# Patient Record
Sex: Female | Born: 1965 | Race: White | Hispanic: No | Marital: Single
Health system: Southern US, Community
[De-identification: ages and names within clinical notes are randomized; demographics above are authoritative.]

---

## 2012-11-29 ENCOUNTER — Emergency Department: Payer: Self-pay | Admitting: Emergency Medicine

## 2012-12-10 ENCOUNTER — Emergency Department: Payer: Self-pay | Admitting: Emergency Medicine

## 2012-12-10 LAB — URINALYSIS, COMPLETE
Bilirubin,UR: NEGATIVE
Glucose,UR: NEGATIVE mg/dL (ref 0–75)
Hyaline Cast: 4
Ph: 5 (ref 4.5–8.0)
RBC,UR: 136 /HPF (ref 0–5)
Specific Gravity: 1.027 (ref 1.003–1.030)
Squamous Epithelial: 11

## 2012-12-25 LAB — COMPREHENSIVE METABOLIC PANEL
Albumin: 3.2 g/dL — ABNORMAL LOW (ref 3.4–5.0)
Alkaline Phosphatase: 322 U/L — ABNORMAL HIGH (ref 50–136)
BUN: 11 mg/dL (ref 7–18)
Calcium, Total: 8.9 mg/dL (ref 8.5–10.1)
Chloride: 106 mmol/L (ref 98–107)
EGFR (African American): >60
Glucose: 118 mg/dL — ABNORMAL HIGH (ref 65–99)
Potassium: 3.9 mmol/L (ref 3.5–5.1)
SGOT(AST): 47 U/L — ABNORMAL HIGH (ref 15–37)
Total Protein: 6.8 g/dL (ref 6.4–8.2)

## 2012-12-25 LAB — URINALYSIS, COMPLETE
Glucose,UR: NEGATIVE mg/dL (ref 0–75)
Nitrite: NEGATIVE
Ph: 6 (ref 4.5–8.0)
Protein: NEGATIVE
Squamous Epithelial: 7
WBC UR: 12 /HPF (ref 0–5)

## 2012-12-25 LAB — DRUG SCREEN, URINE
Amphetamines, Ur Screen: NEGATIVE (ref ?–1000)
Benzodiazepine, Ur Scrn: POSITIVE (ref ?–200)
Cocaine Metabolite,Ur ~~LOC~~: NEGATIVE (ref ?–300)
Methadone, Ur Screen: NEGATIVE (ref ?–300)
Opiate, Ur Screen: NEGATIVE (ref ?–300)
Tricyclic, Ur Screen: NEGATIVE (ref ?–1000)

## 2012-12-25 LAB — TSH: Thyroid Stimulating Horm: <0.01 u[IU]/mL — ABNORMAL LOW

## 2012-12-25 LAB — CBC
MCH: 26.5 pg (ref 26.0–34.0)
MCHC: 32.7 g/dL (ref 32.0–36.0)
MCV: 81 fL (ref 80–100)
Platelet: 153 10*3/uL (ref 150–440)

## 2012-12-26 ENCOUNTER — Inpatient Hospital Stay: Payer: Self-pay | Admitting: Psychiatry

## 2012-12-27 LAB — BEHAVIORAL MEDICINE 1 PANEL
Albumin: 2.8 g/dL — ABNORMAL LOW (ref 3.4–5.0)
Alkaline Phosphatase: 257 U/L — ABNORMAL HIGH (ref 50–136)
Anion Gap: 6 — ABNORMAL LOW (ref 7–16)
BUN: 14 mg/dL (ref 7–18)
Basophil #: 0 10*3/uL (ref 0.0–0.1)
Basophil %: 0.3 %
Bilirubin,Total: 0.4 mg/dL (ref 0.2–1.0)
Calcium, Total: 9 mg/dL (ref 8.5–10.1)
Chloride: 109 mmol/L — ABNORMAL HIGH (ref 98–107)
Co2: 27 mmol/L (ref 21–32)
Creatinine: 0.43 mg/dL — ABNORMAL LOW (ref 0.60–1.30)
EGFR (African American): >60
EGFR (Non-African Amer.): >60
Eosinophil #: 0.2 10*3/uL (ref 0.0–0.7)
Eosinophil %: 3.8 %
Glucose: 103 mg/dL — ABNORMAL HIGH (ref 65–99)
HCT: 36.3 % (ref 35.0–47.0)
HGB: 12.2 g/dL (ref 12.0–16.0)
Lymphocyte #: 3 10*3/uL (ref 1.0–3.6)
Lymphocyte %: 50.9 %
MCH: 27 pg (ref 26.0–34.0)
MCHC: 33.6 g/dL (ref 32.0–36.0)
MCV: 80 fL (ref 80–100)
Monocyte #: 0.6 x10 3/mm (ref 0.2–0.9)
Monocyte %: 10.6 %
Neutrophil #: 2 10*3/uL (ref 1.4–6.5)
Neutrophil %: 34.4 %
Osmolality: 284 (ref 275–301)
Platelet: 131 10*3/uL — ABNORMAL LOW (ref 150–440)
Potassium: 3.9 mmol/L (ref 3.5–5.1)
RBC: 4.52 10*6/uL (ref 3.80–5.20)
RDW: 14.4 % (ref 11.5–14.5)
SGOT(AST): 24 U/L (ref 15–37)
SGPT (ALT): 30 U/L (ref 12–78)
Sodium: 142 mmol/L (ref 136–145)
Thyroid Stimulating Horm: <0.01 u[IU]/mL — ABNORMAL LOW
Total Protein: 6 g/dL — ABNORMAL LOW (ref 6.4–8.2)
WBC: 5.9 10*3/uL (ref 3.6–11.0)

## 2012-12-27 LAB — URINALYSIS, COMPLETE
Bacteria: NONE SEEN
Bilirubin,UR: NEGATIVE
Blood: NEGATIVE
Glucose,UR: NEGATIVE mg/dL (ref 0–75)
Ketone: NEGATIVE
Leukocyte Esterase: NEGATIVE
Nitrite: NEGATIVE
Ph: 8 (ref 4.5–8.0)
Protein: NEGATIVE
RBC,UR: 7 /HPF (ref 0–5)
Specific Gravity: 1.008 (ref 1.003–1.030)
Squamous Epithelial: 6
Transitional Epi: <1
WBC UR: 1 /HPF (ref 0–5)

## 2012-12-31 LAB — URINALYSIS, COMPLETE
Bacteria: NONE SEEN
Bilirubin,UR: NEGATIVE
Glucose,UR: NEGATIVE mg/dL (ref 0–75)
Ketone: NEGATIVE
Leukocyte Esterase: NEGATIVE
Squamous Epithelial: 3

## 2013-01-01 LAB — TSH: Thyroid Stimulating Horm: <0.01 u[IU]/mL — ABNORMAL LOW

## 2013-01-01 LAB — T4, FREE: Free Thyroxine: 3.69 ng/dL — ABNORMAL HIGH (ref 0.76–1.46)

## 2013-11-22 IMAGING — CR DG CHEST 2V
1 series · 2 of 2 positions shown · non-contrast
Comparison: none

REASON FOR EXAM: cough, chills
COMMENTS:

[Series 1: pa · 0.17mm/px · 2 of 2 slices shown]
[im 1/2]
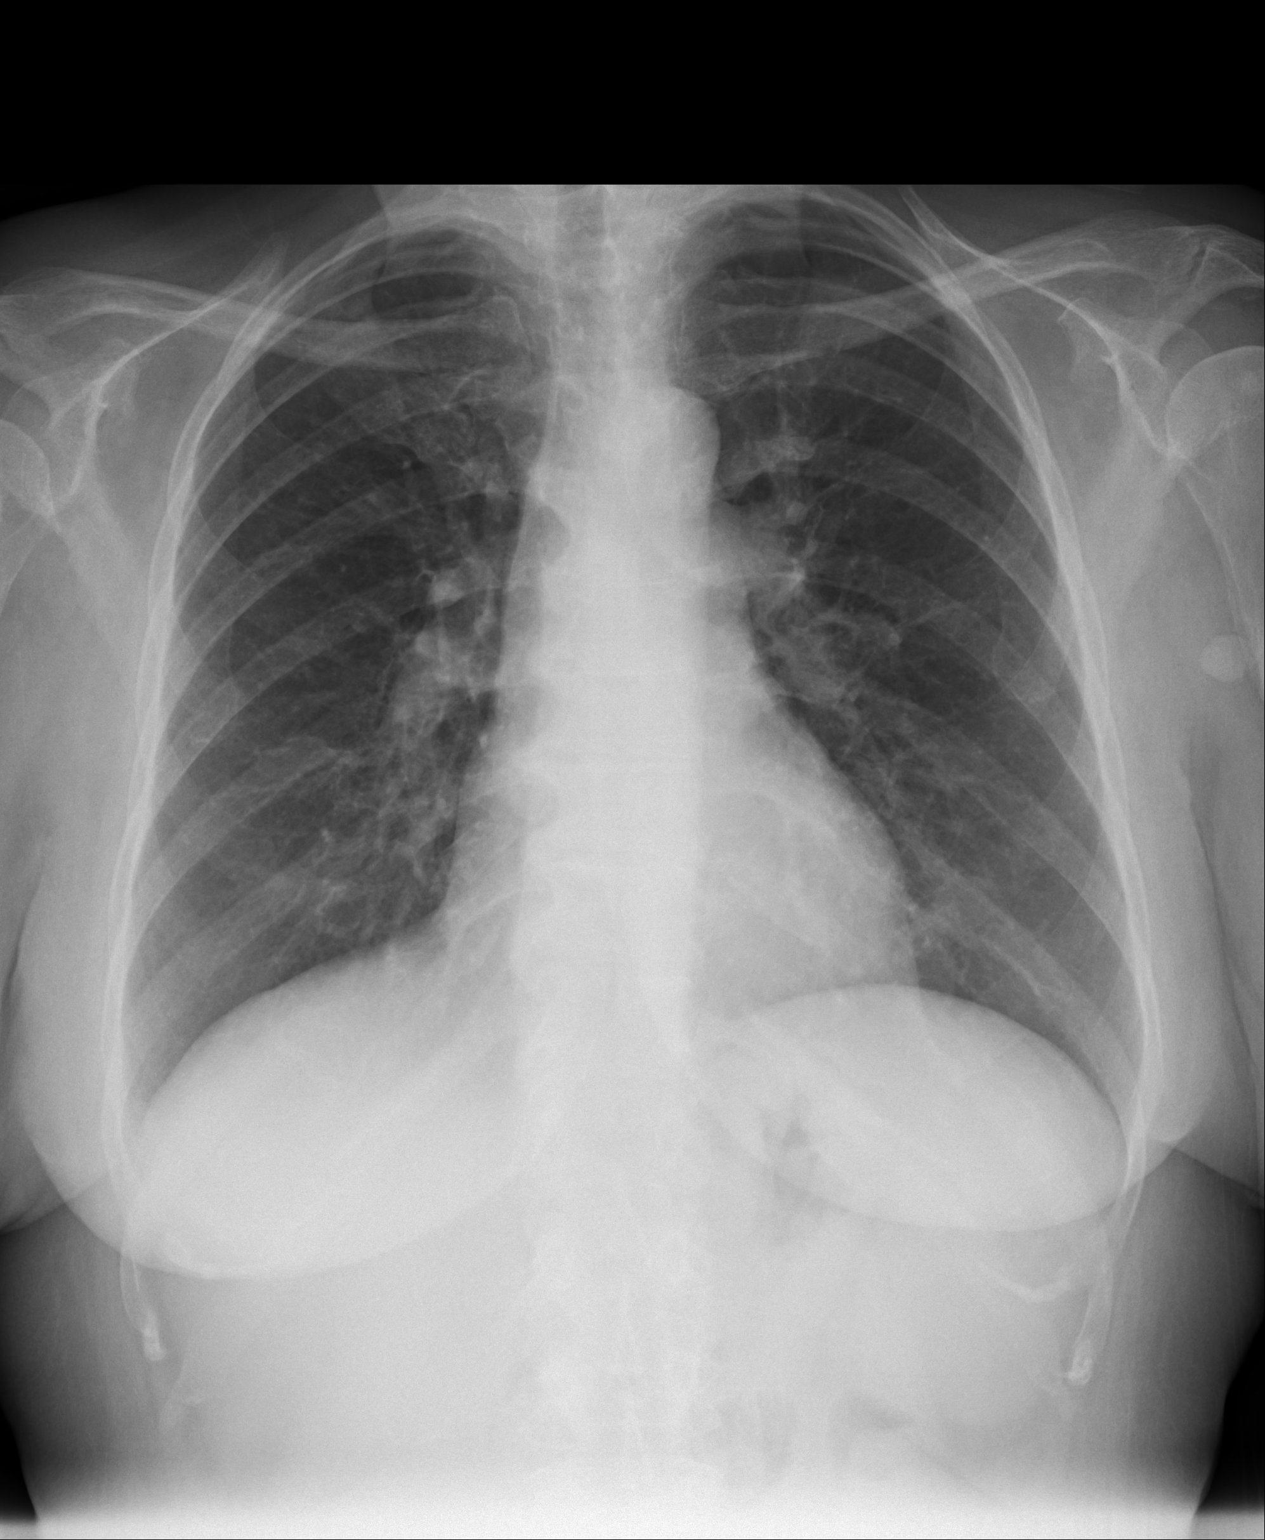
[im 2/2]
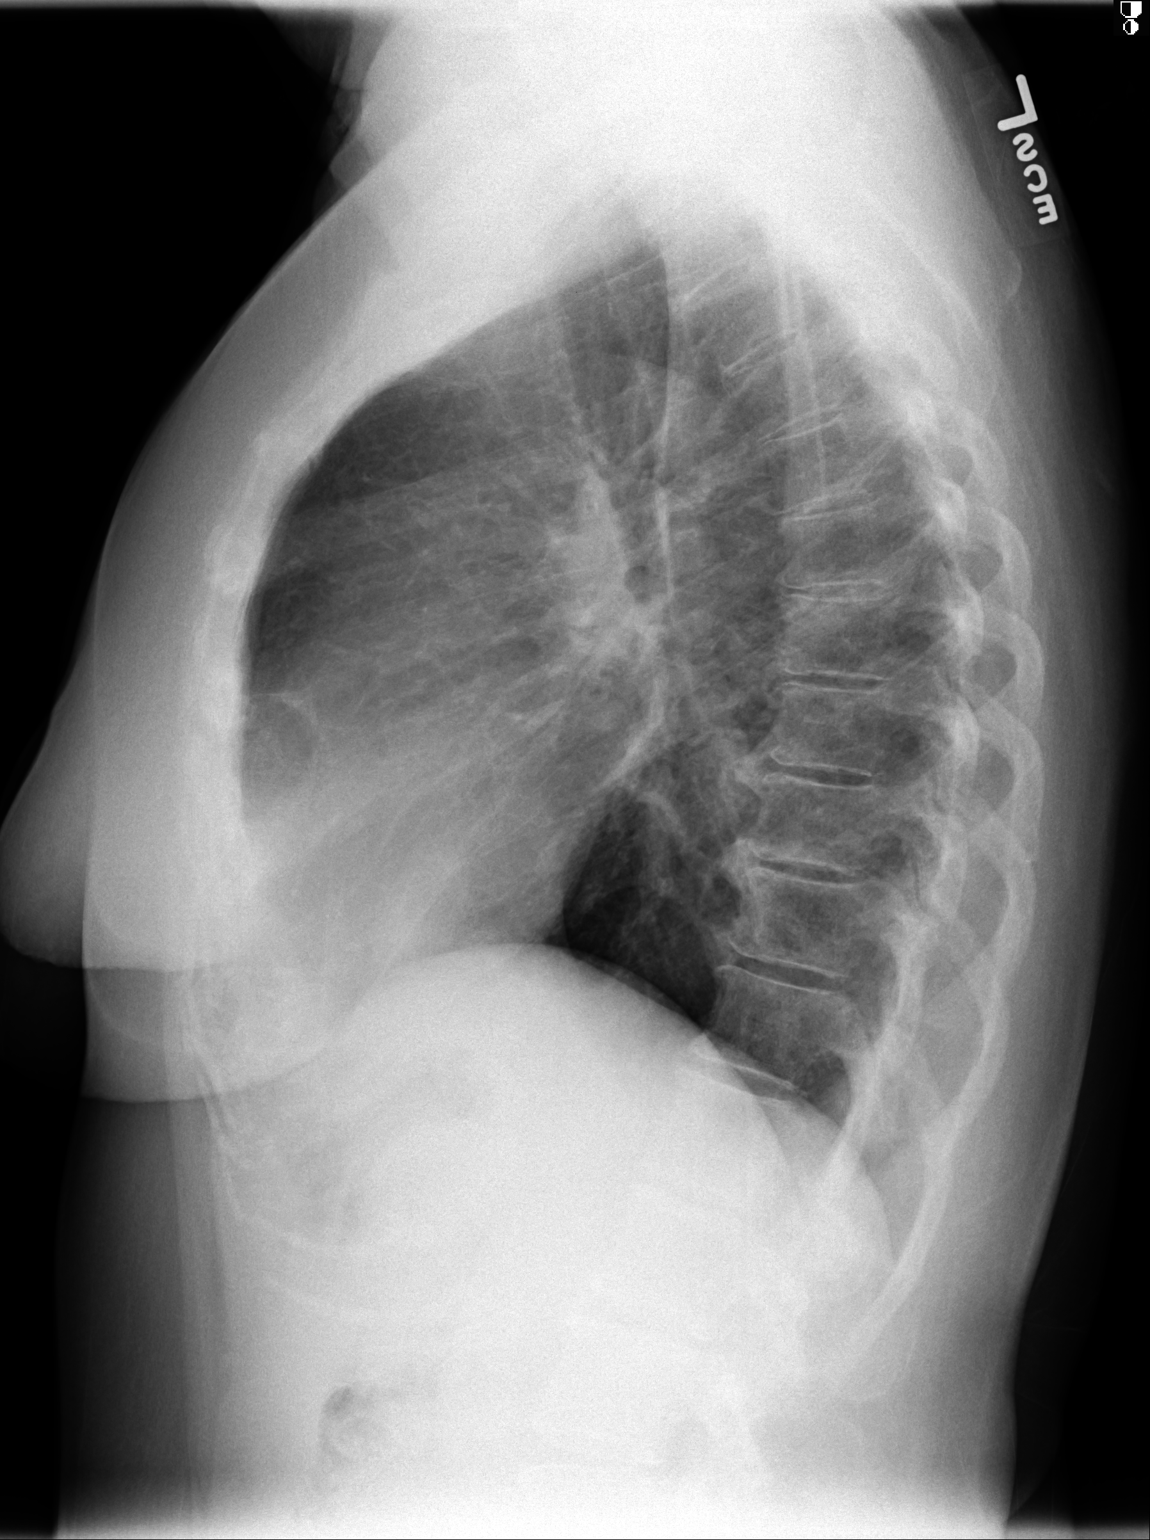

[2 of 2 positions shown; findings below may reference images not displayed]

PROCEDURE:     DXR - DXR CHEST PA (OR AP) AND LATERAL  - November 29, 2012 [DATE]

RESULT:     The lungs are adequately inflated. There is no focal infiltrate.
There are mildly increased interstitial markings in both lungs especially in
the mid and lower zones suggesting subsegmental atelectasis. There is no
pleural effusion. The mediastinum is normal in width. The cardiac silhouette
is normal in size. There are mild degenerative changes of the thoracic
spine. A calcified nodule present in the soft tissues of the left axilla.
IMPRESSION: There are mildly increased interstitial markings in the mid
and lower lung zones suggesting subsegmental atelectasis. There is no
evidence of pneumonia.

[REDACTED]

## 2015-04-02 NOTE — Consult Note (Signed)
Brief Consult Note: Diagnosis: MDD, Recurrent, Severe, W/O PF.   Patient was seen by consultant.   Consult note dictated.   Comments: [facility]  Meds adjusted.  Treatment team to follow.  Electronic Signatures: Rhunette CroftFaheem, Mazelle Huebert S (MD)  (Signed 16-Jan-14 13:26)  Authored: Brief Consult Note   Last Updated: 16-Jan-14 13:26 by Rhunette CroftFaheem, Aydrian Halpin S (MD)

## 2015-04-02 NOTE — Consult Note (Signed)
PATIENT NAME:  Ann Shannon, Ann Shannon MR#:  [id number] DATE OF BIRTH:  1966-06-02  DATE OF CONSULTATION:  12/26/2012  REFERRING PHYSICIAN:  Humberto Leep. Margarita Grizzle, MD CONSULTING PHYSICIAN:  Ardeen Fillers. Garnetta Buddy, MD  REASON FOR CONSULTATION:  Depression.    HISTORY OF PRESENT ILLNESS:  The patient is a 49 year old Caucasian female who drove herself to the Emergency Department due to depressive symptoms and reporting that she has been having suicidal thoughts. The patient stated that "I'm ready for it to be over. I have been staying in my car for the past 2 weeks." The patient stated that she was sent from the shelter from [facility] and it was her time to go. The patient stated that she lost everything. She has 2 older sons who do not have any contact with her.   During my interview with the patient, she reported that she cries all the time and she has been crying nonstop for the past 4 days. She reported that no particular thing happened, but she feels depressed for 4 to 5 months. She feels hopeless, helpless and has been crying. She feels that it is her time to go and she wished that she can go today. She reported that her family does not have any contact with her. Her mother lives in [facility] and one of her sons lives in [place], but they do not want her to be there with them. The patient stated that she has been living in her car for the past 1 month. She is unable to find any job and she spends evenings at the shelter and has been feeling very depressed. She supports herself by hustling. The patient reports that she came to this hospital 2 weeks ago when she was having a kidney infection. She has a long history of diabetes which is uncontrolled as she does not have any money to take the medications. The patient stated that she also was getting Xanax from some of the guys and she drank a couple of beers 2 days ago. She stated that she wants help at this time. She was unable to contract for safety.    PAST PSYCHIATRIC HISTORY:  The patient reported that she was admitted to [facility] at the age of 15 when she was placed by her mother. She does not remember the reason for admission and she does not know the medication.She stated that she has never attempted suicide. She stated that she has a longstanding history of depressive symptoms and persistent suicidal ideations with intent, but no specific plans. She has never attempted suicide. She reported that she has taken some medications in the past, but she does not know the names. She was charged with petty theft in the past.   PAST SUBSTANCE ABUSE HISTORY:  The patient reported that she started drinking at the age of 3. Her last use was a couple of days ago when she used 4 to 5 drinks. She reported that she drank before that in May. She denied any history of seizures or blackouts. She reported that she also used a couple of Xanax a few days ago. She reported that she has lost 200 pounds. She denied using cocaine, marijuana or other drugs. She stated that she is also HIV negative.   FAMILY HISTORY:  The patient reported that her father was admitted to [facility] multiple times. She does not know the reason for the same. She reported that her mother has been diagnosed with depression and she has been  taking medications for the same.   MEDICAL HISTORY:  The patient has a history of diabetes and hypercholesterolemia; however, she has been noncompliant with the medications.   SOCIAL HISTORY:  The patient reported that she is widowed and her husband passed away due to cirrhosis and hepatitis C. She reported that he was a drinker. The patient has 2 sons; 1 lives in [place] and the other lives in [place] and she does not have any contact with them. She currently lives in her car. She stated that she does not have any money. The patient stated that she completed high school. 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 After that, she contracted a bacterial infection in her intestines and was admitted [facility] for almost a month. She lost her job and was unable to find any job after that. She is unable to support herself at this time.   LABORATORY DATA:  Ancillary WBC 7.2, RBC 4.63, hemoglobin 12.3, hematocrit 37.5, platelet count 153, MCV 81, MCH 26.5, RDW 14.4. Glucose was 118, BUN 11, creatinine 0.53, sodium 143, potassium 3.9, chloride 106, bicarbonate 29 and calcium 8.9. Bilirubin 0.6, alkaline phosphatase 322, ALT 41, AST 47, protein 6.8 and anion gap 8. Alcohol level was less than 3. Thyroid less than 0.1. Urine drug screen was positive for benzodiazepines.   MENTAL STATUS EXAMINATION:  The patient is a disheveled-appearing female who appeared older than her stated age. Her mood was depressed. Affect was anxious. Thought process was tangential. Thought content was non-delusional. She does not have any perceptual disturbances at this time. She admitted to feeling very depressed and hopeless. She denied having any suicidal or homicidal ideations or plans, but was unable to contract for safety. She demonstrated fair insight and judgment.   DIAGNOSTIC IMPRESSION:  AXIS I:  1.  Major depressive disorder, recurrent, severe, without psychotic features.  2.  Benzodiazepine abuse.  AXIS II: None.  AXIS III: Copy from the medical history.  AXIS IV: Severe mental illness, poor social support, noncompliance with medications.  AXIS V: Current Global Assessment of Functioning is 25.   TREATMENT PLAN:  1.  The patient will be admitted to the behavioral health unit.  2.  She will be started back on her medications.  3.  She will also be started on CIWA protocol as she was drinking as well as using Xanax.  4.  She will be started on Prozac 20 mg in the morning. 5.  She will be started on trazodone 100 mg at bedtime.  6.  She will also be given Vistaril 25 mg p.o.  q. 6 hours p.r.n. for anxiety.  7.  Treatment team will follow her on a regular basis.    Thank you for allowing me to participate in the care of this patient.    ____________________________ Ardeen FillersUzma S. Garnetta BuddyFaheem, MD usf:si D: 12/26/2012 13:46:25 ET T: 12/26/2012 15:34:30 ET JOB#: [id number]  cc: Ardeen FillersUzma S. Garnetta BuddyFaheem, MD, <Dictator> Rhunette CroftUZMA S Reiko Vinje MD ELECTRONICALLY SIGNED 12/31/2012 12:15

## 2015-04-02 NOTE — H&P (Signed)
PATIENT NAME:  Ann Shannon, Ann Shannon MR#:  [id number] DATE OF BIRTH:  09-24-1966  DATE OF ADMISSION:  12/26/2012  IDENTIFYING INFORMATION: A 49 year old woman who brought herself to the Emergency Room.   CHIEF COMPLAINT: "I can't stand feeling like this, I can't stop crying."   HISTORY OF PRESENT ILLNESS: The patient states that she has not been able to stop crying for the last 4 to 5 days, feeling severely depressed and down. She has been having thoughts about wishing she were dead and wanting to die but has not had any plan or intention to hurt herself. She has been sleeping poorly for a longer time. Energy is low. The patient has been feeling run down and has lost a great deal of weight over the last year. The patient reports that she has been aware of feeling depressed and sad for at least the last 2 to 3 weeks but that her mother tells her that she thinks that her mood has been bad ever since this summer. The patient is not taking any psychiatric medicine. She is not getting any therapy or outpatient mental health treatment. She reports that 2 days ago she had 4 to 5 drinks of alcohol but that this was a very rare occasion, and  prior to that she thinks she had not had any alcohol since last May. She does not use any other drugs. She does not report any psychotic symptoms. She is under a great deal of stress. She lost her job earlier this year and since then has not been able to find work. She is homeless and has been living out of her car, feels is estranged from her family.   PAST PSYCHIATRIC HISTORY: She says she had one hospitalization at [facility] when she was age 89, but that was because her mother caught her drinking. She has had no psychiatric hospitalizations since then. She says a few years ago her primary care doctor prescribed Paxil for her because she was "stressed."  She says that she did not even take it because she was afraid of it. No other psychiatric medicine. She denies any history of  suicide attempts.   SOCIAL HISTORY: The patient is a widow. Her husband died about 5 years ago. She has 2 adult sons who live [place]. Her mother lives locally. The patient has not been able to find work for months. She is in financially bad situation and living out of her car.   PAST MEDICAL HISTORY: She says she had an intestinal bacterial infection diagnosed and treated earlier this summer. It had been going on making her sick for some time before it was correctly diagnosed. She is under the impression it is completely treated. Also, she says she reported here a few weeks ago for what was diagnosed as a kidney infection. She says she took all of her antibiotics then and is feeling like she no longer has the back pain from it. She indicates that over the last year she has lost about 200 pounds despite the fact that she has not been trying to lose weight and still eats regularly, and this has disturbed her.   FAMILY HISTORY: She says that her father was in [facility], and she has other relatives who have had mental health and substance abuse problems.   CURRENT MEDICATIONS: None.   ALLERGIES: No known drug allergies.   REVIEW OF SYSTEMS: Mood is down, sad and depressed. Energy low. Poor sleep. Passive suicidal  thoughts. Denies hallucinations. Denies delusions. She has been losing weight for no known reason.   MENTAL STATUS EXAM: A somewhat disheveled woman who looks her stated age. Cooperative with the interview. Eye contact normal. Psychomotor activity a little bit fidgety. Mood stated as depressed. Affect is dysphoric and anxious. Thoughts are lucid with no obvious delusions or loosening of associations. Denies auditory or visual hallucinations. Denies any homicidal ideation. Has passive suicidal thoughts. The patient appears to be of average intelligence. Short and long-term memory grossly intact. Insight and judgment are adequate.   PHYSICAL  EXAMINATION: GENERAL: The patient is a little bit disheveled. Her hair does look like it may have been thinning. Some of her skin is hanging loose on her.  HEENT: Pupils are equal and reactive. Face is symmetric. Oral mucosa dry. Her limbs have abnormal range of motion at all joints. Gait is normal. Normal strength and reflexes symmetric throughout. Cranial nerves symmetric.  LUNGS: Clear with no wheezes.  HEART: Regular rate and rhythm.  ABDOMEN: Soft, nontender, normal bowel sounds.  VITAL SIGNS: Temperature 98, pulse 90, respirations 20, blood pressure 129/63.   LABORATORY RESULTS: Drug screen positive for benzodiazepines. Free thyroxine is over 8.0. The TSH is virtually undetectable. The urinalysis continues to show positive leukocyte esterase, positive red blood cells and white blood cells and trace bacteria. Alcohol undetected. Chemistry shows elevated alkaline phosphatase at 322. CBC normal.   ASSESSMENT: A 49 year old woman with multiple symptoms of major depression including suicidal ideation, needs hospitalization. I agree with the initiation of Prozac as a treatment for that. The patient also needs treatment for what appears to be hyperthyroidism.   TREATMENT PLAN: Admit to psychiatry. Supportive and educational therapy. Start Prozac for depression. The patient is agreeable, and we have discussed potential side effects. I am going to get in endocrinology consult for her thyroid. Because she still seems to have some traces of urinary tract infection, I am going to put her on ciprofloxacin for a few days. Trazodone for sleep. Engage the patient daily in groups and activities.   DIAGNOSIS, PRINCIPAL AND PRIMARY:  AXIS I: Major depression, single, severe.   SECONDARY DIAGNOSES: AXIS I: No diagnosis.   AXIS II: Deferred.   AXIS III: Hyperthyroidism, urinary tract infection.   AXIS IV: Severe from homelessness.   AXIS V: Functioning at time of evaluation 30.    ____________________________ Audery AmelJohn T. Lerin Jech, MD jtc:cb D: 12/26/2012 15:15:37 ET T: 12/26/2012 15:54:38 ET JOB#: [id number]  cc: Audery AmelJohn T. Lithzy Bernard, MD, <Dictator> Audery AmelJOHN T Lounette Sloan MD ELECTRONICALLY SIGNED 12/27/2012 15:43

## 2015-04-02 NOTE — Consult Note (Signed)
Chief Complaint and History:   Referring Physician Dr. Toni Amendlapacs    Chief Complaint Hyperthyroidism   Allergies:  No Known Allergies:   Assessment/Plan:   Assessment/Plan Patient was seen, examined, and chart was reviewed. She was admitted from the ED yesterday for depression. Labs showed an undectable TSH of <0.015 and an elevated free T4 of >8 ng/dl. She has no known prior h/o thyroid disease. No use of lithium or amiodarone or exposure to contast dye. No fever. No neck pain.  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  P/ 1. Obtain total T3 and TRAb 2. Start methimazole 30 mg bid. 3. No need for beta blocker as she is not tachycardic 4. Repeat thyroid labs in 3-4 days.  [facility] be [facility] If she remains hospitalized at that time then I will see her here. I will make an out-pt followup appt for her also in 2-3 weeks for clinic follow-up.  Full consult will be dictated.   Electronic Signatures: Raj JanusSolum, Aracely Rickett M (MD)  (Signed 17-Jan-14 13:19)  Authored: Chief Complaint and History, ALLERGIES, Assessment/Plan   Last Updated: 17-Jan-14 13:19 by Raj JanusSolum, Tilden Broz M (MD)

## 2015-04-02 NOTE — Consult Note (Signed)
PATIENT NAME:  Ann Shannon, Ann Shannon MR#:  [id number] DATE OF BIRTH:  February 06, 1966  DATE OF CONSULTATION:  12/27/2012  REFERRING PHYSICIAN:  Mordecai Rasmussen, MD CONSULTING PHYSICIAN:  A. Wendall Mola, MD  CHIEF COMPLAINT: Hyperthyroidism.   HISTORY OF PRESENT ILLNESS: This is a 49 year old female seen in consultation for hyperthyroidism. She was admitted yesterday from the ED. She presented with complaints of depression. She has been admitted to the Behavioral Medicine unit. She had been feeling depressed for the previous week. In addition, she reported a 100 pound weight loss over the last year. Apparently she was quite ill with some sort of bacterial infection in March of 2013. There was a time period where she had very poor intake in the setting of that illness. Recently she feels she has been eating okay. She has had poor energy. She has had a tremor. She denies heat intolerance, although does report night sweats. She denies heart racing or palpitations. In the ED, labs included an undetectable TSH of less than 0.010 which was repeated and was the same as well as an elevated free T4 of greater than 8. She denies any recent exposure to lithium or amiodarone. She has had no known exposure to iodinated contrast dye. She denies neck pain. She denies fevers. She denies dysphasia.  PAST MEDICAL HISTORY: 1. Possible gastroenteritis, March 2013, details not clear.  2. History of insulin-dependent diabetes, resolved.  3. History of obesity, resolved.  4. Tobacco dependence.   OUTPATIENT MEDICATIONS: Over-the-counter biotin.   PAST SURGICAL HISTORY: 1. Vaginal hysterectomy and bilateral salpingo-oophorectomy.  2. Tubal ligation.   SOCIAL HISTORY: The patient is homeless. She is unemployed. She smokes 1/2 pack of cigarettes per day. She rarely drinks alcohol. She is a widow.   FAMILY HISTORY: Mother had thyroid disease which was treated with radioactive iodine ablation. Also positive for depression.    ALLERGIES: No known drug allergies.  REVIEW OF SYSTEMS:   GENERAL: No fever. She has had weight loss, as per HPI.   HEENT: No blurred vision. No sore throat.   NECK: No neck pain. No dysphasia.   CARDIAC: No palpitations. No chest pain.   PULMONARY: No cough. She denies dyspnea on exertion.   ABDOMEN: Reports good appetite. Denies nausea.   EXTREMITIES: Denies leg swelling. Denies focal weakness.   PSYCHIATRIC: She reports anxiousness and episodes of panic attacks. She reports poor sleep.   NEUROLOGIC: Reports a tremor. Denies falls.   LABORATORY DATA: Thyroid labs as per HPI.  In addition, labs this morning showed glucose 103, BUN 14, creatinine 0.43, sodium 142, potassium 3.9, chloride 109, CO2 27, anion gap 6, osmolality 284, calcium 9.0, total protein 6.0, albumin 2.8, alkaline phosphatase 257, AST 24 and ALT 30. Hematocrit 36.3%, WBC 5.9 and platelets 131.   ASSESSMENT: A 49 year old female admitted for depression and found to have hyperthyroidism. She has evidence of a goiter on exam and a positive family history of hyperthyroidism. I suspect she likely has Graves hyperthyroidism.   RECOMMENDATIONS: 1. I will obtain thyroid antibodies to confirm this is Graves' disease.  2. I will obtain a total T3 as it would be helpful to track this with treatment as well.  3. Start methimazole 30 mg two times a day. I cautioned the patient about side effects to include rash.  4. Repeat thyroid function tests in 3 to 4 days. I will be unavailable to see the patient again until Wednesday of next week. I am available by page if needed. We may  need to adjust her methimazole to control hyperthyroidism adequately, at that time.  5. Typically beta blockers are helpful for symptoms of hyperthyroidism, however, I do not feel she has any tachycardia and her tremor is very mild. No beta blockers are recommended at this time.  6. She is advised to quit smoking.   Thank you for the kind request for  consultation. I will follow along with you, but will not be available to see her again in house until next Wednesday. Should she get discharged before then, I do plan to schedule an outpatient follow-up with me in about 2 weeks. If she remains in house next Wednesday, I will see her at that time.  ____________________________ A. Wendall MolaMelissa Brandis Wixted, MD ams:sb D: 12/27/2012 13:52:41 ET T: 12/27/2012 14:36:47 ET JOB#: [id number]  cc: A. Wendall MolaMelissa Astou Lada, MD, <Dictator> Macy MisA. MELISSA Leonid Manus MD ELECTRONICALLY SIGNED 01/02/2013 16:15

## 2015-04-02 NOTE — Discharge Summary (Signed)
PATIENT NAME:  Ann Shannon, Ann Shannon MR#:  [id number] DATE OF BIRTH:  Dec 14, 1965  DATE OF ADMISSION:  12/26/2012 DATE OF DISCHARGE:  01/02/2013   HOSPITAL COURSE: See dictated history and physical for details of admission. This 49 year old woman came to the Emergency Room with multiple symptoms of severe depression. Mood has been down and feeling hopeless for months. She had been crying for days and days. Had been having some thoughts about wishing that she was dead. Physically she had been feeling bad and had been losing a great deal of weight. She had had a small amount of alcohol to drink recently, but normally was not abusing drugs. She was, however, not on any psychiatric medicine and had significant social problems and she was living in her car. In the hospital, she has been engaging and appropriate in treatment. She has been started on antidepressant medication and tolerated it well. She has attended groups and participated well. Gradually her mood has improved. She has not shown any suicidal behavior in the hospital. From a mood standpoint, she has continued to have some anxiety but has been able to work on dealing with it. At the time of discharge, she is feeling more optimistic. Not feeling overwhelmed with panic. She is agreeable to outpatient treatment and will be referred to [facility] locally. She has a place to stay with a friend at least for the time being locally. Medically it transpired that the patient was suffering from hyperthyroidism, which was serendipitously discovered on admission, and was shown eventually to be Graves' disease. She was seen by Dr. Tedd Sias, the endocrinologist, and started on appropriate medication for that. She has tolerated the medicine well. She will be continued on that and will follow up with an outpatient visit with Dr. Tedd Sias after discharge. She also was concerned about a urinary tract infection. Her urinalysis continued to show some possible infection and she was treated  with ciprofloxacin again in the hospital. Followup UA he continues to show microscopic red blood cells. The patient has been counseled that while this might be still from the healing of her recent nephritis, she should get followup for that as well and have it checked to make sure there is not any other cause. She is agreeable to the plan.   MENTAL STATUS EXAMINATION AT DISCHARGE: Neatly dressed and groomed woman, looks her stated age, cooperative and pleasant in the interview. Good eye contact and normal psychomotor activity. Speech is normal in rate, tone and volume. Affect somewhat anxious, but reactive and appropriate. Mood stated as being better. Thoughts are generally lucid without loosening of associations or delusions. No sign of delusional thinking. Denies auditory or visual hallucinations. Denies suicidal or homicidal ideation. Shows improved judgment and insight, good at this time. Normal intelligence. Alert and oriented x 4.   LABORATORY RESULTS: Admission labs showed a drug screen positive for benzodiazepines. TSH was undetectable. Followup free thyroxine was greater than 8. Alcohol undetectable. Chemistry panel showed a low creatinine at 0.53, glucose elevated at 118, alkaline phosphatase elevated at 322, AST elevated at 47, albumin low at 3.2. CBC unremarkable. Admission urinalysis still showed 2+ leukocyte esterase, positive red blood cells but also white blood cells and trace bacteria. Followup chemistries showed an improvement, although her alkaline phosphatase continued to be somewhat elevated at 257. Platelet count a little bit low at 131. Followup urinalysis appeared to be clear, and then another one today just slightly positive for red blood cells. The patient had appropriate tests done for working up  Graves' disease and has a very positive thyrotropin receptor antibody. Followup thyroid tests done prior to discharge show that her TSH is still virtually undetectable, but her free thyroxine  has come down to 3.6, which is still pretty high.   DISCHARGE MEDICATIONS: Prozac 20 mg p.o. daily, Vistaril 25 mg q.6 h. p.r.n. for anxiety, trazodone 100 mg at night, Tapazole 30 mg twice a day.   DISPOSITION: Discharged to stay at a friend's house. Follow up with [facility].   DIAGNOSES PRINCIPAL AND PRIMARY:  AXIS I:  1.  Major depression, severe, recurrent.  2.  No diagnosis.  AXIS II: Deferred.  AXIS III: Hyperthyroidism due to very probably Graves' disease, ongoing recurrent urinary tract infection now hopefully resolved.  AXIS IV: Severe from continued homelessness.  AXIS V: Functioning at time of discharge: 55.    ____________________________ Audery AmelJohn T. Janyth Riera, MD [email or web address] D: 01/02/2013 14:00:14 ET T: 01/02/2013 15:28:02 ET JOB#: [id number]  cc: Audery AmelJohn T. Jalisia Puchalski, MD, <Dictator> Audery AmelJOHN T Rameses Ou MD ELECTRONICALLY SIGNED 01/03/2013 17:53
